# Patient Record
Sex: Female | Born: 2005
Health system: Southern US, Community
[De-identification: ages and names within clinical notes are randomized; demographics above are authoritative.]

## PROBLEM LIST (undated history)

## (undated) DIAGNOSIS — Z789 Other specified health status: Secondary | ICD-10-CM

## (undated) DIAGNOSIS — F909 Attention-deficit hyperactivity disorder, unspecified type: Secondary | ICD-10-CM

## (undated) HISTORY — DX: Other specified health status: Z78.9

---
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
## 2024-04-22 ENCOUNTER — Encounter: Payer: Self-pay | Admitting: Family Medicine

## 2024-04-22 ENCOUNTER — Ambulatory Visit: Admitting: Family Medicine

## 2024-04-22 VITALS — BP 112/76 | HR 100 | Temp 98.0°F | Resp 16 | Ht 68.0 in | Wt 311.8 lb

## 2024-04-22 DIAGNOSIS — N3001 Acute cystitis with hematuria: Secondary | ICD-10-CM

## 2024-04-22 DIAGNOSIS — Z23 Encounter for immunization: Secondary | ICD-10-CM

## 2024-04-22 DIAGNOSIS — Z0000 Encounter for general adult medical examination without abnormal findings: Secondary | ICD-10-CM | POA: Diagnosis not present

## 2024-04-22 MED ORDER — SULFAMETHOXAZOLE-TRIMETHOPRIM 800-160 MG PO TABS: 1.0000 | ORAL_TABLET | Freq: Two times a day (BID) | ORAL | 0 refills | Status: AC

## 2024-04-22 MED ORDER — FLUCONAZOLE 150 MG PO TABS: ORAL_TABLET | ORAL | 0 refills | Status: AC

## 2024-04-22 NOTE — Progress Notes (Signed)
 Chief Complaint  Patient presents with   Annual Exam    Annual Exam     Well Woman Charlene Fuentes is here for a complete physical.   Her last physical was >1 year ago.  Current diet: in general, a healthy diet. Current exercise: active at work. Contraception? Yes Fatigue out of ordinary? No Seatbelt? Yes  Health Maintenance Tetanus- Yes HIV screening- Yes Hep C screening- Yes  UTI 2 d of freq, burning, bleeding, urgency. No dc, new sexual partners, vag dc, fevers, abd pain, N/V. Has had UTI's, this is similar. Has not tried anything at home. No hx of recurrent UTI's.   Past Medical History:  Diagnosis Date   No known health problems      Past Surgical History:  Procedure Laterality Date   WISDOM TOOTH EXTRACTION  2021    Medications  Medications Ordered Prior to Encounter[1]   Allergies Allergies[2]  Review of Systems: Constitutional:  no unexpected weight changes Eye:  no recent significant change in vision Ear/Nose/Mouth/Throat:  Ears:  no tinnitus or vertigo and no recent change in hearing Nose/Mouth/Throat:  no complaints of nasal congestion, no sore throat Cardiovascular: no chest pain Respiratory:  no cough and no shortness of breath Gastrointestinal:  no abdominal pain, no change in bowel habits GU:  As noted in HPI Musculoskeletal/Extremities:  no pain of the joints Integumentary (Skin/Breast):  no abnormal skin lesions reported Neurologic:  no headaches Endocrine:  denies fatigue Hematologic/Lymphatic:  No areas of easy bleeding  Exam BP 112/76 (BP Location: Left Arm, Patient Position: Sitting)   Pulse 100   Temp 98 F (36.7 C) (Oral)   Resp 16   Ht 5' 8 (1.727 m)   Wt (!) 311 lb 12.8 oz (141.4 kg)   SpO2 98%   BMI 47.41 kg/m  General:  well developed, well nourished, in no apparent distress Skin:  no significant moles, warts, or growths Head:  no masses, lesions, or tenderness Eyes:  pupils equal and round, sclera anicteric without  injection Ears:  canals without lesions, TMs shiny without retraction, no obvious effusion, no erythema Nose:  nares patent, mucosa normal, and no drainage  Throat/Pharynx:  lips and gingiva without lesion; tongue and uvula midline; non-inflamed pharynx; no exudates or postnasal drainage Neck: neck supple without adenopathy, thyromegaly, or masses Lungs:  clear to auscultation, breath sounds equal bilaterally, no respiratory distress Cardio:  regular rate and rhythm, no bruits, no LE edema Abdomen:  abdomen soft, nontender; bowel sounds normal; no masses or organomegaly Genital: Defer to GYN Musculoskeletal:  symmetrical muscle groups noted without atrophy or deformity Extremities:  no clubbing, cyanosis, or edema, no deformities, no skin discoloration Neuro:  gait normal; deep tendon reflexes normal and symmetric Psych: well oriented with normal range of affect and appropriate judgment/insight  Assessment and Plan  Well adult exam - Plan: CBC, Comprehensive metabolic panel with GFR, Lipid panel  Need for influenza vaccination - Plan: Flu vaccine trivalent PF, 6mos and older(Flulaval,Afluria,Fluarix,Fluzone)  Acute cystitis with hematuria - Plan: Urine Culture, Urinalysis   Well 18 y.o. female. Counseled on diet and exercise. Flu shot today.  UTI: Empiric tx w Bactrim  for 3 d. Diflucan  prn. She is not pregnant or planning on pregnancy.  Other orders as above. Follow up in 1 yr. The patient voiced understanding and agreement to the plan.  Mabel Mt Folcroft, DO 04/22/2024 4:56 PM     [1]  Current Outpatient Medications on File Prior to Visit  Medication Sig Dispense Refill  clotrimazole -betamethasone  (LOTRISONE ) cream Apply 1 Application topically 2 (two) times daily. 45 g 0   Norethindrone-Ethinyl Estradiol-Fe Biphas (LO LOESTRIN FE ) 1 MG-10 MCG / 10 MCG tablet Take 1 tablet by mouth daily.     No current facility-administered medications on file prior to visit.  [2]  No Known Allergies

## 2024-04-22 NOTE — Patient Instructions (Addendum)
 Give us  2-3 business days to get the results of your labs back.   Keep the diet clean and stay active.  Start with the Bactrim . If you have yeast symptoms, take the fluconazole .  Let us  know if you need anything.

## 2024-04-23 ENCOUNTER — Ambulatory Visit: Payer: Self-pay | Admitting: Family Medicine

## 2024-04-23 LAB — CBC
HCT: 37.6 % (ref 36.0–49.0)
Hemoglobin: 12.4 g/dL (ref 12.0–16.0)
MCHC: 33 g/dL (ref 31.0–37.0)
MCV: 79.3 fl (ref 78.0–98.0)
Platelets: 361 K/uL (ref 150.0–575.0)
RBC: 4.74 Mil/uL (ref 3.80–5.70)
RDW: 15.5 % (ref 11.4–15.5)
WBC: 8.5 K/uL (ref 4.5–13.5)

## 2024-04-23 LAB — COMPREHENSIVE METABOLIC PANEL WITH GFR
ALT: 26 U/L (ref 0–35)
AST: 17 U/L (ref 5–37)
Albumin: 4 g/dL (ref 3.5–5.2)
Alkaline Phosphatase: 88 U/L (ref 47–119)
BUN: 12 mg/dL (ref 6–23)
CO2: 27 meq/L (ref 19–32)
Calcium: 9.1 mg/dL (ref 8.4–10.5)
Chloride: 103 meq/L (ref 96–112)
Creatinine, Ser: 0.64 mg/dL (ref 0.40–1.20)
GFR: 128.32 mL/min (ref >60.00)
Glucose, Bld: 83 mg/dL (ref 70–99)
Potassium: 4.2 meq/L (ref 3.5–5.1)
Sodium: 139 meq/L (ref 135–145)
Total Bilirubin: 0.4 mg/dL (ref 0.2–1.2)
Total Protein: 7.3 g/dL (ref 6.0–8.3)

## 2024-04-23 LAB — LIPID PANEL
Cholesterol: 152 mg/dL (ref 28–200)
HDL: 40.2 mg/dL (ref >39.00)
LDL Cholesterol: 95 mg/dL (ref 0–99)
NonHDL: 112.09
Total CHOL/HDL Ratio: 4
Triglycerides: 83 mg/dL (ref 0.0–149.0)
VLDL: 16.6 mg/dL (ref 0.0–40.0)

## 2024-04-23 LAB — URINALYSIS
Bilirubin Urine: NEGATIVE
Hgb urine dipstick: NEGATIVE
Ketones, ur: NEGATIVE
Leukocytes,Ua: NEGATIVE
Nitrite: NEGATIVE
Specific Gravity, Urine: 1.015 (ref 1.000–1.030)
Total Protein, Urine: NEGATIVE
Urine Glucose: NEGATIVE
Urobilinogen, UA: 0.2 (ref 0.0–1.0)
pH: 6.5 (ref 5.0–8.0)

## 2024-04-23 LAB — URINE CULTURE
MICRO NUMBER:: 17356071
Result:: NO GROWTH
SPECIMEN QUALITY:: ADEQUATE

## 2025-04-23 ENCOUNTER — Encounter: Admitting: Family Medicine
